# Patient Record
Sex: Female | Born: 2000 | Race: Black or African American | Hispanic: No | Marital: Single | Smoking: Never smoker
Health system: Southern US, Community
[De-identification: ages and names within clinical notes are randomized; demographics above are authoritative.]

## PROBLEM LIST (undated history)

## (undated) DIAGNOSIS — D649 Anemia, unspecified: Secondary | ICD-10-CM

---
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
## 2022-06-18 DIAGNOSIS — N938 Other specified abnormal uterine and vaginal bleeding: Secondary | ICD-10-CM | POA: Diagnosis not present

## 2022-06-18 DIAGNOSIS — Z3046 Encounter for surveillance of implantable subdermal contraceptive: Secondary | ICD-10-CM | POA: Diagnosis not present

## 2023-08-28 ENCOUNTER — Ambulatory Visit
Admission: EM | Admit: 2023-08-28 | Discharge: 2023-08-28 | Disposition: A | Discharge status: Home / Self Care | Payer: Medicaid Other | Attending: Internal Medicine | Admitting: Internal Medicine

## 2023-08-28 DIAGNOSIS — B9789 Other viral agents as the cause of diseases classified elsewhere: Secondary | ICD-10-CM | POA: Diagnosis not present

## 2023-08-28 DIAGNOSIS — R070 Pain in throat: Secondary | ICD-10-CM | POA: Diagnosis not present

## 2023-08-28 DIAGNOSIS — J988 Other specified respiratory disorders: Secondary | ICD-10-CM

## 2023-08-28 LAB — POCT RAPID STREP A (OFFICE): Rapid Strep A Screen: NEGATIVE

## 2023-08-28 MED ORDER — PROMETHAZINE-DM 6.25-15 MG/5ML PO SYRP: 5.0000 mL | ORAL_SOLUTION | Freq: Three times a day (TID) | ORAL | 0 refills | Status: AC | PRN

## 2023-08-28 MED ORDER — CETIRIZINE HCL 10 MG PO TABS: 10.0000 mg | ORAL_TABLET | Freq: Every day | ORAL | 0 refills | Status: AC

## 2023-08-28 MED ORDER — IBUPROFEN 600 MG PO TABS: 600.0000 mg | ORAL_TABLET | Freq: Four times a day (QID) | ORAL | 0 refills | Status: DC | PRN

## 2023-08-28 MED ORDER — PSEUDOEPHEDRINE HCL 30 MG PO TABS: 30.0000 mg | ORAL_TABLET | Freq: Three times a day (TID) | ORAL | 0 refills | Status: AC | PRN

## 2023-08-28 NOTE — ED Triage Notes (Signed)
Pt c/o sore throat, head congestion, runny nose x 3 days-denies fever-no OTC meds PTA-NAD-steady gait

## 2023-08-28 NOTE — ED Provider Notes (Signed)
[facility]  Note:  This document was prepared using Conservation officer, historic buildings and may include unintentional dictation errors.  MRN: [id number] DOB: 2001-05-14  Subjective:   Paula Shaffer is a 22 y.o. female presenting for 3-day history of acute onset sinus congestion, throat pain, runny nose, slight malaise and fatigue.  No known sick exposures.  No history of asthma or chronic conditions.  No current facility-administered medications for this encounter.  Current Outpatient Medications:    acetaminophen (TYLENOL) 500 MG tablet, Take 2 tablets (1,000 mg total) by mouth every 8 (eight) hours as needed (pain)., Disp: 60 tablet, Rfl: 0   Ferrous Sulfate (IRON PO), Take by mouth., Disp: , Rfl:    ibuprofen (ADVIL) 600 MG tablet, Take 1 tablet (600 mg total) by mouth every 6 (six) hours as needed (pain)., Disp: 40 tablet, Rfl: 0   Prenatal Multivit-Min-Fe-FA (PRE-NATAL PO), Take by mouth., Disp: , Rfl:    Allergies  Allergen Reactions   Penicillins     Past Medical History:  Diagnosis Date   Anemia      History reviewed. No pertinent surgical history.  No family history on file.  Social History   Tobacco Use   Smoking status: Never   Smokeless tobacco: Never  Vaping Use   Vaping status: Every Day  Substance Use Topics   Alcohol use: Yes    Comment: occ   Drug use: Yes    Types: Marijuana    ROS   Objective:   Vitals: BP 104/69 (BP Location: Right Arm)   Pulse 84   Temp 97.8 F (36.6 C) (Oral)   Resp 20   LMP 08/20/2023   SpO2 98%   Physical Exam Constitutional:      General: She is not in acute distress.    Appearance: Normal appearance. She is well-developed and normal weight. She is not ill-appearing, toxic-appearing or diaphoretic.  HENT:     Head: Normocephalic and atraumatic.     Right Ear: Tympanic membrane, ear canal and external ear normal. No drainage or tenderness. No middle ear effusion. There is no  impacted cerumen. Tympanic membrane is not erythematous or bulging.     Left Ear: Tympanic membrane, ear canal and external ear normal. No drainage or tenderness.  No middle ear effusion. There is no impacted cerumen. Tympanic membrane is not erythematous or bulging.     Nose: Nose normal. No congestion or rhinorrhea.     Mouth/Throat:     Mouth: Mucous membranes are moist. No oral lesions.     Pharynx: No pharyngeal swelling, oropharyngeal exudate, posterior oropharyngeal erythema or uvula swelling.     Tonsils: No tonsillar exudate or tonsillar abscesses.  Eyes:     General: No scleral icterus.       Right eye: No discharge.        Left eye: No discharge.     Extraocular Movements: Extraocular movements intact.     Right eye: Normal extraocular motion.     Left eye: Normal extraocular motion.     Conjunctiva/sclera: Conjunctivae normal.  Cardiovascular:     Rate and Rhythm: Normal rate and regular rhythm.     Heart sounds: Normal heart sounds. No murmur heard.    No friction rub. No gallop.  Pulmonary:     Effort: Pulmonary effort is normal. No respiratory distress.     Breath sounds: No stridor. No wheezing, rhonchi or rales.  Chest:     Chest wall: No tenderness.  Musculoskeletal:     Cervical back: Normal range of motion and neck supple.  Lymphadenopathy:     Cervical: No cervical adenopathy.  Skin:    General: Skin is warm and dry.  Neurological:     General: No focal deficit present.     Mental Status: She is alert and oriented to person, place, and time.  Psychiatric:        Mood and Affect: Mood normal.        Behavior: Behavior normal.     Results for orders placed or performed during the hospital encounter of 08/28/23 (from the past 24 hour(s))  POCT rapid strep A     Status: None   Collection Time: 08/28/23  5:57 PM  Result Value Ref Range   Rapid Strep A Screen Negative Negative    Assessment and Plan :   PDMP not reviewed this encounter.  1. Viral  respiratory infection   2. Throat pain    Throat culture pending.  Deferred imaging given clear cardiopulmonary exam, hemodynamically stable vital signs.  Suspect viral URI, viral syndrome. Physical exam findings reassuring and vital signs stable for discharge. Advised supportive care, offered symptomatic relief. Counseled patient on potential for adverse effects with medications prescribed/recommended today, ER and return-to-clinic precautions discussed, patient verbalized understanding.     Wallis Bamberg, [place] 08/28/23 [id number]

## 2023-08-28 NOTE — Discharge Instructions (Signed)
We will manage this as a viral illness. For sore throat or cough try using a honey-based tea. Use 3 teaspoons of honey with juice squeezed from half lemon. Place shaved pieces of ginger into 1/2-1 cup of water and warm over stove top. Then mix the ingredients and repeat every 4 hours as needed. Please take ibuprofen 600mg  every 6 hours with food alternating with OR taken together with Tylenol 500mg -650mg  every 6 hours for throat pain, fevers, aches and pains. Hydrate very well with at least 2 liters of water. Eat light meals such as soups (chicken and noodles, vegetable, chicken and wild rice).  Do not eat foods that you are allergic to.  Taking an antihistamine like Zyrtec (10mg  daily) can help against postnasal drainage, sinus congestion which can cause sinus pain, sinus headaches, throat pain, painful swallowing, coughing.  You can take this together with pseudoephedrine (Sudafed) at a dose of 30mg  3 times a day or twice daily as needed for the same kind of nasal drip, congestion.  Use cough medications as needed.

## 2023-08-31 LAB — CULTURE, GROUP A STREP (THRC)

## 2023-10-18 ENCOUNTER — Ambulatory Visit
Admission: EM | Admit: 2023-10-18 | Discharge: 2023-10-18 | Disposition: A | Discharge status: Home / Self Care | Payer: Medicaid Other | Attending: Family Medicine | Admitting: Family Medicine

## 2023-10-18 DIAGNOSIS — N3001 Acute cystitis with hematuria: Secondary | ICD-10-CM | POA: Diagnosis not present

## 2023-10-18 DIAGNOSIS — Z113 Encounter for screening for infections with a predominantly sexual mode of transmission: Secondary | ICD-10-CM | POA: Insufficient documentation

## 2023-10-18 LAB — POCT URINALYSIS DIP (MANUAL ENTRY)
Bilirubin, UA: NEGATIVE
Glucose, UA: NEGATIVE mg/dL
Ketones, POC UA: NEGATIVE mg/dL
Nitrite, UA: POSITIVE — AB
Protein Ur, POC: 30 mg/dL — AB
Spec Grav, UA: 1.025 (ref 1.010–1.025)
Urobilinogen, UA: 1 U/dL
pH, UA: 7.5 (ref 5.0–8.0)

## 2023-10-18 LAB — POCT URINE PREGNANCY: Preg Test, Ur: NEGATIVE

## 2023-10-18 MED ORDER — NITROFURANTOIN MONOHYD MACRO 100 MG PO CAPS: 100.0000 mg | ORAL_CAPSULE | Freq: Two times a day (BID) | ORAL | 0 refills | Status: AC

## 2023-10-18 NOTE — ED Triage Notes (Signed)
 Pt presents with c/o painful urination x 1 wk.   Pt has some lower abd pain and has a odor in urine.

## 2023-10-18 NOTE — Discharge Instructions (Addendum)
 The clinic will contact you with results of the urine culture and STD testing done today if positive.  Start Macrobid  twice daily for 7 days.  Lots of rest and fluids.  Please follow-up with your PCP if your symptoms do not improve.  Please go to the ER for any worsening symptoms.  I hope you feel better soon!

## 2023-10-18 NOTE — ED Provider Notes (Addendum)
 [facility]    CSN: [id number] Arrival date & time: 10/18/23  1405      History   Chief Complaint No chief complaint on file.   HPI Paula Shaffer is a 23 y.o. female presents for urinary symptoms.  Patient reports 1 week of suprapubic pressure/pain at the end of urination.  Denies any urgency, frequency, hematuria, fevers, nausea/vomiting, flank pain.  No vaginal discharge or STD concern but she would like screening while here.  Denies pregnancy or breast-feeding.  She has not taken any OTC medications for her symptoms.  No other concerns at this time.  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 No pertinent surgical history.  OB History     Gravida  1   Para  1   Term  1   Preterm      AB      Living  1      SAB      IAB      Ectopic      Multiple  0   Live Births  1            Home Medications    Prior to Admission medications   Medication Sig Start Date End Date Taking? Authorizing Provider  nitrofurantoin , macrocrystal-monohydrate, (MACROBID ) 100 MG capsule Take 1 capsule (100 mg total) by mouth 2 (two) times daily for 7 days. 10/18/23 10/25/23 Yes Boe Deans, Jodi R, NP  acetaminophen  (TYLENOL ) 500 MG tablet Take 2 tablets (1,000 mg total) by mouth every 8 (eight) hours as needed (pain). 12/03/21   Clem Tawni HERO, MD   cetirizine  (ZYRTEC  ALLERGY) 10 MG tablet Take 1 tablet (10 mg total) by mouth daily. 08/28/23   Christopher Savannah, PA-C  Ferrous Sulfate (IRON  PO) Take by mouth.    [provider]  ibuprofen  (ADVIL ) 600 MG tablet Take 1 tablet (600 mg total) by mouth every 6 (six) hours as needed. 08/28/23   Christopher Savannah, PA-C  Prenatal Multivit-Min-Fe-FA (PRE-NATAL PO) Take by mouth.    [provider]  promethazine -dextromethorphan (PROMETHAZINE -DM) 6.25-15 MG/5ML syrup Take 5 mLs by mouth 3 (three) times daily as needed for cough. 08/28/23   Christopher Savannah, PA-C  pseudoephedrine  (SUDAFED) 30 MG tablet Take 1 tablet (30 mg total) by mouth every 8 (eight) hours as needed for congestion. 08/28/23   Christopher Savannah, PA-C    Family History History reviewed. No pertinent family history.  Social History Social History   Tobacco Use   Smoking status: Never   Smokeless tobacco: Never  Vaping Use   Vaping status: Every Day  Substance Use Topics   Alcohol use: Yes    Comment: occ   Drug use: Yes    Types: Marijuana     Allergies   Penicillins   Review of Systems Review of Systems  Genitourinary:  Positive for dysuria.     Physical Exam Triage Vital Signs  ED Triage Vitals  Encounter Vitals Group     BP 10/18/23 1503 111/69     Systolic BP Percentile --      Diastolic BP Percentile --      Pulse Rate 10/18/23 1503 70     Resp 10/18/23 1503 18     Temp 10/18/23 1503 98.2 F (36.8 C)     Temp Source 10/18/23 1503 Oral     SpO2 10/18/23 1503 98 %     Weight --      Height --      Head Circumference --      Peak Flow --      Pain Score 10/18/23 1502 4     Pain Loc --      Pain Education --      Exclude from Growth Chart --    No data found.  Updated Vital Signs BP 111/69 (BP Location: Right Arm)   Pulse 70   Temp 98.2 F (36.8 C) (Oral)   Resp 18   LMP 09/28/2023 (Exact Date)   SpO2 98%   Breastfeeding No   Visual Acuity Right Eye Distance:   Left Eye Distance:    Bilateral Distance:    Right Eye Near:   Left Eye Near:    Bilateral Near:     Physical Exam Vitals and nursing note reviewed.  Constitutional:      General: She is not in acute distress.    Appearance: Normal appearance. She is not ill-appearing.  HENT:     Head: Normocephalic and atraumatic.  Eyes:     Pupils: Pupils are equal, round, and reactive to light.  Cardiovascular:     Rate and Rhythm: Normal rate.  Pulmonary:     Effort: Pulmonary effort is normal.  Abdominal:     Tenderness: There is no right CVA tenderness or left CVA tenderness.  Skin:    General: Skin is warm and dry.  Neurological:     General: No focal deficit present.     Mental Status: She is alert and oriented to person, place, and time.  Psychiatric:        Mood and Affect: Mood normal.        Behavior: Behavior normal.      UC Treatments / Results  Labs (all labs ordered are listed, but only abnormal results are displayed) Labs Reviewed  POCT URINALYSIS DIP (MANUAL ENTRY) - Abnormal; Notable for the following components:      Result Value   Color, UA orange (*)    Clarity, UA cloudy (*)    Blood, UA trace-intact (*)    Protein Ur, POC =30 (*)    Nitrite, UA Positive (*)    Leukocytes, UA Small (1+) (*)    All other components within normal limits  URINE CULTURE  RPR  HIV ANTIBODY (ROUTINE TESTING W REFLEX)  POCT URINE PREGNANCY  CERVICOVAGINAL ANCILLARY ONLY    EKG   Radiology No results found.  Procedures Procedures (including critical care time)  Medications Ordered in UC Medications - No data to display  Initial Impression / Assessment and Plan / UC Course  I have reviewed the triage vital signs and the nursing notes.  Pertinent labs & imaging results that were available during my care of the patient were reviewed by me and considered in my medical decision making (see chart for details).     Reviewed exam and symptoms with patient.  No red flags.  UA positive for  UTI, will  culture and start antibiotics.  STD testing is ordered we will contact for any positive results.  Encouraged rest and fluids.  PCP follow-up if symptoms do not improve.  ER precautions reviewed. Final Clinical Impressions(s) / UC Diagnoses   Final diagnoses:  Acute cystitis with hematuria  Screening examination for STD (sexually transmitted disease)     Discharge Instructions      The clinic will contact you with results of the urine culture and STD testing done today if positive.  Start Macrobid  twice daily for 7 days.  Lots of rest and fluids.  Please follow-up with your PCP if your symptoms do not improve.  Please go to the ER for any worsening symptoms.  I hope you feel better soon!     ED Prescriptions     Medication Sig Dispense Auth. Provider   nitrofurantoin , macrocrystal-monohydrate, (MACROBID ) 100 MG capsule Take 1 capsule (100 mg total) by mouth 2 (two) times daily for 7 days. 14 capsule Alecea Trego, Jodi R, NP      PDMP not reviewed this encounter.   Loreda Myla SAUNDERS, NP 10/18/23 1521    Loreda Myla SAUNDERS, NP 10/18/23 [phone]

## 2023-10-19 LAB — CERVICOVAGINAL ANCILLARY ONLY
Chlamydia: NEGATIVE
Comment: NEGATIVE
Comment: NEGATIVE
Comment: NORMAL
Neisseria Gonorrhea: NEGATIVE
Trichomonas: NEGATIVE

## 2023-10-20 LAB — URINE CULTURE: Culture: >=100000 — AB

## 2023-10-20 LAB — RPR: RPR Ser Ql: NONREACTIVE

## 2023-10-20 LAB — HIV ANTIBODY (ROUTINE TESTING W REFLEX): HIV Screen 4th Generation wRfx: NONREACTIVE

## 2023-10-23 ENCOUNTER — Emergency Department (HOSPITAL_COMMUNITY)
Admission: EM | Admit: 2023-10-23 | Discharge: 2023-10-24 | Disposition: A | Discharge status: Home / Self Care | Payer: Medicaid Other | Attending: Emergency Medicine | Admitting: Emergency Medicine

## 2023-10-23 ENCOUNTER — Encounter (HOSPITAL_COMMUNITY): Payer: Self-pay | Admitting: Emergency Medicine

## 2023-10-23 ENCOUNTER — Other Ambulatory Visit: Payer: Self-pay

## 2023-10-23 DIAGNOSIS — K029 Dental caries, unspecified: Secondary | ICD-10-CM | POA: Insufficient documentation

## 2023-10-23 DIAGNOSIS — K0889 Other specified disorders of teeth and supporting structures: Secondary | ICD-10-CM | POA: Diagnosis present

## 2023-10-23 MED ORDER — LIDOCAINE VISCOUS HCL 2 % MT SOLN
15.0000 mL | Freq: Once | OROMUCOSAL | Status: AC
  Administered 2023-10-24: 15 mL via OROMUCOSAL
  Filled 2023-10-23: qty 15

## 2023-10-23 MED ORDER — CLINDAMYCIN HCL 300 MG PO CAPS
300.0000 mg | ORAL_CAPSULE | Freq: Once | ORAL | Status: AC
  Administered 2023-10-24: 300 mg via ORAL
  Filled 2023-10-23: qty 1

## 2023-10-23 NOTE — ED Triage Notes (Signed)
Pt reports dental pain that has been going on for a while now. Was seen at [facility] and given meds but reports it hasn't been working., Reports it is hard to swallow. Pt speaking in full sentences.

## 2023-10-24 MED ORDER — CLINDAMYCIN HCL 300 MG PO CAPS: 300.0000 mg | ORAL_CAPSULE | Freq: Three times a day (TID) | ORAL | 0 refills | Status: DC

## 2023-10-24 NOTE — ED Provider Notes (Signed)
[place] [facility] AT [facility] Provider Note   CSN: [id number] Arrival date & time: 10/23/23  2256     History  Chief Complaint  Patient presents with   Dental Pain    Paula Shaffer is a 23 y.o. female.  The history is provided by the patient.  Dental Pain Location:  Lower Lower teeth location:  31/RL 2nd molar and 30/RL 1st molar Quality:  Aching Severity:  Severe Duration:  2 months Timing:  Constant Progression:  Unchanged Chronicity:  New Context: dental caries   Previous work-up:  Dental exam Relieved by:  Nothing Worsened by:  Nothing Ineffective treatments:  None tried Associated symptoms: no congestion, no facial swelling and no fever        Home Medications Prior to Admission medications   Medication Sig Start Date End Date Taking? Authorizing Provider  clindamycin (CLEOCIN) 300 MG capsule Take 1 capsule (300 mg total) by mouth 3 (three) times daily. 10/24/23  Yes Ladarrion Telfair, MD  acetaminophen (TYLENOL) 500 MG tablet Take 2 tablets (1,000 mg total) by mouth every 8 (eight) hours as needed (pain). 12/03/21   Worthy Rancher, MD  cetirizine (ZYRTEC ALLERGY) 10 MG tablet Take 1 tablet (10 mg total) by mouth daily. 08/28/23   Wallis Bamberg, PA-C  Ferrous Sulfate (IRON PO) Take by mouth.    [provider]  ibuprofen (ADVIL) 600 MG tablet Take 1 tablet (600 mg total) by mouth every 6 (six) hours as needed. 08/28/23   Wallis Bamberg, PA-C  nitrofurantoin, macrocrystal-monohydrate, (MACROBID) 100 MG capsule Take 1 capsule (100 mg total) by mouth 2 (two) times daily for 7 days. 10/18/23 10/25/23  Radford Pax, NP  Prenatal Multivit-Min-Fe-FA (PRE-NATAL PO) Take by mouth.    [provider]  promethazine-dextromethorphan (PROMETHAZINE-DM) 6.25-15 MG/5ML syrup Take 5 mLs by mouth 3 (three) times daily as needed for cough. 08/28/23   Wallis Bamberg, PA-C  pseudoephedrine (SUDAFED) 30 MG tablet Take 1 tablet (30 mg total) by  mouth every 8 (eight) hours as needed for congestion. 08/28/23   Wallis Bamberg, PA-C      Allergies    Penicillins    Review of Systems   Review of Systems  Constitutional:  Negative for fever.  HENT:  Positive for dental problem. Negative for congestion and facial swelling.   Eyes:  Negative for redness.  All other systems reviewed and are negative.   Physical Exam Updated Vital Signs BP (!) 145/133 (BP Location: Left Arm)   Pulse 97   Temp 98.2 F (36.8 C) (Oral)   Resp 18   LMP 09/28/2023 (Exact Date)   SpO2 100%  Physical Exam Vitals and nursing note reviewed.  Constitutional:      General: She is not in acute distress.    Appearance: She is well-developed.  HENT:     Head: Normocephalic and atraumatic.     Nose: Nose normal.     Mouth/Throat:     Comments: Dental caries  Eyes:     Pupils: Pupils are equal, round, and reactive to light.  Cardiovascular:     Rate and Rhythm: Normal rate and regular rhythm.     Pulses: Normal pulses.     Heart sounds: Normal heart sounds.  Pulmonary:     Effort: Pulmonary effort is normal. No respiratory distress.     Breath sounds: Normal breath sounds.  Abdominal:     General: Bowel sounds are normal. There is no distension.     Palpations:  Abdomen is soft.     Tenderness: There is no abdominal tenderness. There is no guarding or rebound.  Musculoskeletal:        General: Normal range of motion.     Cervical back: Neck supple.  Skin:    General: Skin is dry.     Capillary Refill: Capillary refill takes less than 2 seconds.     Findings: No erythema or rash.  Neurological:     General: No focal deficit present.     Deep Tendon Reflexes: Reflexes normal.  Psychiatric:        Mood and Affect: Mood normal.     ED Results / Procedures / Treatments   Labs (all labs ordered are listed, but only abnormal results are displayed) Labs Reviewed - No data to display  EKG None  Radiology No results  found.  Procedures Procedures    Medications Ordered in ED Medications  clindamycin (CLEOCIN) capsule 300 mg (300 mg Oral Given 10/24/23 0002)  lidocaine (XYLOCAINE) 2 % viscous mouth solution 15 mL (15 mLs Mouth/Throat Given 10/24/23 0003)    ED Course/ Medical Decision Making/ A&P                                 Medical Decision Making Dental pain x 2 months seeing a dentist on [place]   Amount and/or Complexity of Data Reviewed External Data Reviewed: notes.    Details: Previous notes reviewed   Risk Prescription drug management. Risk Details: Clindamycin initiated for dental caries.  Follow up with dentistry for ongoing care     Final Clinical Impression(s) / ED Diagnoses Final diagnoses:  Dental caries   Return for intractable cough, coughing up blood, fevers > 100.4 unrelieved by medication, shortness of breath, intractable vomiting, chest pain, shortness of breath, weakness, numbness, changes in speech, facial asymmetry, abdominal pain, passing out, Inability to tolerate liquids or food, cough, altered mental status or any concerns. No signs of systemic illness or infection. The patient is nontoxic-appearing on exam and vital signs are within normal limits.  I have reviewed the triage vital signs and the nursing notes. Pertinent labs & imaging results that were available during my care of the patient were reviewed by me and considered in my medical decision making (see chart for details). After history, exam, and medical workup I feel the patient has been appropriately medically screened and is safe for discharge home. Pertinent diagnoses were discussed with the patient. Patient was given return precautions.  Rx / DC Orders ED Discharge Orders          Ordered    clindamycin (CLEOCIN) 300 MG capsule  3 times daily        10/24/23 0025              Ulysee Fyock, MD 10/24/23 1027

## 2023-11-19 ENCOUNTER — Ambulatory Visit
Admission: EM | Admit: 2023-11-19 | Discharge: 2023-11-19 | Disposition: A | Discharge status: Home / Self Care | Payer: Medicaid Other | Attending: Family Medicine | Admitting: Family Medicine

## 2023-11-19 DIAGNOSIS — J029 Acute pharyngitis, unspecified: Secondary | ICD-10-CM | POA: Diagnosis not present

## 2023-11-19 DIAGNOSIS — R051 Acute cough: Secondary | ICD-10-CM

## 2023-11-19 LAB — POCT RAPID STREP A (OFFICE): Rapid Strep A Screen: NEGATIVE

## 2023-11-19 MED ORDER — BENZONATATE 200 MG PO CAPS: 200.0000 mg | ORAL_CAPSULE | Freq: Three times a day (TID) | ORAL | 0 refills | Status: AC | PRN

## 2023-11-19 MED ORDER — AZITHROMYCIN 250 MG PO TABS: 250.0000 mg | ORAL_TABLET | Freq: Every day | ORAL | 0 refills | Status: AC

## 2023-11-19 NOTE — Discharge Instructions (Signed)
The clinic will contact you with results of the throat swabs done today if positive.  Start Zithromax as prescribed.  You may also do salt water gargles and warm liquids such as teas and honey.  Continue Tylenol or ibuprofen over-the-counter as needed.  Please follow-up with your PCP if your symptoms do not improve.  Please go to the ER for any worsening symptoms.  Hope you feel better soon!

## 2023-11-19 NOTE — ED Triage Notes (Signed)
Pt presents to UC for c/o sore throat, fevers, white patches on tonsils x3 days. Took tylenol and promethazine

## 2023-11-19 NOTE — ED Provider Notes (Signed)
[facility]    CSN: [id number] Arrival date & time: 11/19/23  0840      History   Chief Complaint No chief complaint on file.   HPI Paula Shaffer is a 23 y.o. female  presents for evaluation of URI symptoms for 3 days. Patient reports associated symptoms of sore throat with white patches, tactile fevers, cough congestion. Denies N/V/D, ear pain, body aches, shortness of breath. Patient does not have a hx of asthma. Patient does vape.  Reports no sick contacts.  Pt has taken Tylenol and a previous prescription for Promethazine DM for symptoms.  Denies pregnancy or breast-feeding.  Pt has no other concerns at this time.   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 No pertinent surgical history.  OB History     Gravida  1   Para  1   Term  1   Preterm      AB      Living  1      SAB      IAB      Ectopic      Multiple  0   Live Births  1            Home Medications    Prior to Admission medications   Medication Sig Start Date End Date Taking? Authorizing Provider  azithromycin (ZITHROMAX) 250 MG tablet Take 1 tablet (250 mg total) by mouth daily. Take first 2 tablets together, then 1 every day until finished. 11/19/23  Yes Radford Pax, NP  benzonatate (TESSALON) 200 MG capsule Take 1 capsule (200 mg total) by mouth 3 (three)  times daily as needed. 11/19/23  Yes Radford Pax, NP  acetaminophen (TYLENOL) 500 MG tablet Take 2 tablets (1,000 mg total) by mouth every 8 (eight) hours as needed (pain). 12/03/21   Worthy Rancher, MD  cetirizine (ZYRTEC ALLERGY) 10 MG tablet Take 1 tablet (10 mg total) by mouth daily. 08/28/23   Wallis Bamberg, PA-C  clindamycin (CLEOCIN) 300 MG capsule Take 1 capsule (300 mg total) by mouth 3 (three) times daily. 10/24/23   Palumbo, April, MD  Ferrous Sulfate (IRON PO) Take by mouth.    [provider]  ibuprofen (ADVIL) 600 MG tablet Take 1 tablet (600 mg total) by mouth every 6 (six) hours as needed. 08/28/23   Wallis Bamberg, PA-C  Prenatal Multivit-Min-Fe-FA (PRE-NATAL PO) Take by mouth.    [provider]  promethazine-dextromethorphan (PROMETHAZINE-DM) 6.25-15 MG/5ML syrup Take 5 mLs by mouth 3 (three) times daily as needed for cough. 08/28/23   Wallis Bamberg, PA-C  pseudoephedrine (SUDAFED) 30 MG tablet Take 1 tablet (30 mg total) by mouth every 8 (eight) hours as needed for congestion. 08/28/23   Wallis Bamberg, PA-C    Family History History reviewed. No pertinent family history.  Social History Social History   Tobacco Use   Smoking status: Never  Smokeless tobacco: Never  Vaping Use   Vaping status: Every Day  Substance Use Topics   Alcohol use: Yes    Comment: occ   Drug use: Yes    Types: Marijuana     Allergies   Penicillins   Review of Systems Review of Systems  Constitutional:  Positive for fever.  HENT:  Positive for congestion and sore throat.   Respiratory:  Positive for cough.      Physical Exam Triage Vital Signs ED Triage Vitals  Encounter Vitals Group     BP 11/19/23 0847 99/64     Systolic BP Percentile --      Diastolic BP Percentile --      Pulse Rate 11/19/23 0847 96     Resp 11/19/23 0847 16     Temp 11/19/23 0847 98.9 F (37.2 C)     Temp Source 11/19/23 0847 Oral     SpO2 11/19/23 0847 96 %     Weight --      Height  --      Head Circumference --      Peak Flow --      Pain Score 11/19/23 0845 10     Pain Loc --      Pain Education --      Exclude from Growth Chart --    No data found.  Updated Vital Signs BP 99/64 (BP Location: Right Arm)   Pulse 96   Temp 98.9 F (37.2 C) (Oral)   Resp 16   SpO2 96%   Breastfeeding No   Visual Acuity Right Eye Distance:   Left Eye Distance:   Bilateral Distance:    Right Eye Near:   Left Eye Near:    Bilateral Near:     Physical Exam Vitals and nursing note reviewed.  Constitutional:      General: She is not in acute distress.    Appearance: She is well-developed. She is not ill-appearing.  HENT:     Head: Normocephalic and atraumatic.     Right Ear: Tympanic membrane and ear canal normal.     Left Ear: Tympanic membrane and ear canal normal.     Nose: Congestion present.     Mouth/Throat:     Mouth: Mucous membranes are moist.     Pharynx: Oropharynx is clear. Uvula midline. Posterior oropharyngeal erythema present. No oropharyngeal exudate.     Tonsils: No tonsillar exudate or tonsillar abscesses.     Comments: Blisters noted bilateral tonsils Eyes:     Conjunctiva/sclera: Conjunctivae normal.     Pupils: Pupils are equal, round, and reactive to light.  Cardiovascular:     Rate and Rhythm: Normal rate and regular rhythm.     Heart sounds: Normal heart sounds.  Pulmonary:     Effort: Pulmonary effort is normal.     Breath sounds: Normal breath sounds.  Musculoskeletal:     Cervical back: Normal range of motion and neck supple.  Lymphadenopathy:     Cervical: No cervical adenopathy.  Skin:    General: Skin is warm and dry.  Neurological:     General: No focal deficit present.     Mental Status: She is alert and oriented to person, place, and time.  Psychiatric:        Mood and Affect: Mood normal.        Behavior: Behavior normal.      UC Treatments / Results  Labs (all labs ordered are listed, but only abnormal results are  displayed) Labs Reviewed  CULTURE, GROUP A STREP [facility])  POCT RAPID STREP A (OFFICE)  CYTOLOGY, (ORAL, ANAL, URETHRAL) ANCILLARY ONLY    EKG   Radiology No results found.  Procedures Procedures (including critical care time)  Medications Ordered in UC Medications - No data to display  Initial Impression / Assessment and Plan / UC Course  I have reviewed the triage vital signs and the nursing notes.  Pertinent labs & imaging results that were available during my care of the patient were reviewed by me and considered in my medical decision making (see chart for details).     Reviewed exam and symptoms with patient.  No red flags.  Negative rapid strep, will culture.  Patient requested gonorrhea of the throat testing, swab taken will contact for any positive results.  Given presentation will treat with Zithromax while awaiting results, patient has penicillin allergy.  Discussed salt gargles/warm liquids and OTC analgesics as needed.  PCP follow-up as symptoms do not improve.  ER precautions reviewed and patient verbalized understanding. Final Clinical Impressions(s) / UC Diagnoses   Final diagnoses:  Acute pharyngitis, unspecified etiology  Acute cough     Discharge Instructions      The clinic will contact you with results of the throat swabs done today if positive.  Start Zithromax as prescribed.  You may also do salt water gargles and warm liquids such as teas and honey.  Continue Tylenol or ibuprofen over-the-counter as needed.  Please follow-up with your PCP if your symptoms do not improve.  Please go to the ER for any worsening symptoms.  Hope you feel better soon!    ED Prescriptions     Medication Sig Dispense Auth. Provider   azithromycin (ZITHROMAX) 250 MG tablet Take 1 tablet (250 mg total) by mouth daily. Take first 2 tablets together, then 1 every day until finished. 6 tablet Radford Pax, NP   benzonatate (TESSALON) 200 MG capsule Take 1 capsule (200 mg  total) by mouth 3 (three) times daily as needed. 20 capsule Radford Pax, NP      PDMP not reviewed this encounter.   Radford Pax, NP 11/19/23 [phone]

## 2023-11-20 LAB — CYTOLOGY, (ORAL, ANAL, URETHRAL) ANCILLARY ONLY
Chlamydia: NEGATIVE
Comment: NEGATIVE
Comment: NORMAL
Neisseria Gonorrhea: NEGATIVE

## 2023-11-21 ENCOUNTER — Emergency Department (HOSPITAL_COMMUNITY)
Admission: EM | Admit: 2023-11-21 | Discharge: 2023-11-21 | Disposition: A | Discharge status: Home / Self Care | Payer: Medicaid Other | Attending: Emergency Medicine | Admitting: Emergency Medicine

## 2023-11-21 DIAGNOSIS — Z79899 Other long term (current) drug therapy: Secondary | ICD-10-CM | POA: Insufficient documentation

## 2023-11-21 DIAGNOSIS — Z20822 Contact with and (suspected) exposure to covid-19: Secondary | ICD-10-CM | POA: Diagnosis not present

## 2023-11-21 DIAGNOSIS — J029 Acute pharyngitis, unspecified: Secondary | ICD-10-CM | POA: Insufficient documentation

## 2023-11-21 LAB — RESP PANEL BY RT-PCR (RSV, FLU A&B, COVID)  RVPGX2
Influenza A by PCR: NEGATIVE
Influenza B by PCR: NEGATIVE
Resp Syncytial Virus by PCR: NEGATIVE
SARS Coronavirus 2 by RT PCR: NEGATIVE

## 2023-11-21 LAB — CULTURE, GROUP A STREP (THRC)

## 2023-11-21 LAB — MONONUCLEOSIS SCREEN: Mono Screen: NEGATIVE

## 2023-11-21 MED ORDER — CLINDAMYCIN PALMITATE HCL 75 MG/5ML PO SOLR: 300.0000 mg | Freq: Three times a day (TID) | ORAL | 0 refills | Status: DC

## 2023-11-21 MED ORDER — KETOROLAC TROMETHAMINE 30 MG/ML IJ SOLN
30.0000 mg | Freq: Once | INTRAMUSCULAR | Status: AC
  Administered 2023-11-21: 30 mg via INTRAMUSCULAR
  Filled 2023-11-21: qty 1

## 2023-11-21 MED ORDER — LIDOCAINE VISCOUS HCL 2 % MT SOLN
15.0000 mL | Freq: Once | OROMUCOSAL | Status: AC
  Administered 2023-11-21: 15 mL via OROMUCOSAL
  Filled 2023-11-21: qty 15

## 2023-11-21 MED ORDER — PREDNISONE 20 MG PO TABS: 40.0000 mg | ORAL_TABLET | Freq: Every day | ORAL | 0 refills | Status: AC

## 2023-11-21 MED ORDER — MELOXICAM 15 MG PO TABS: 15.0000 mg | ORAL_TABLET | Freq: Every day | ORAL | 0 refills | Status: AC | PRN

## 2023-11-21 MED ORDER — DEXAMETHASONE 4 MG PO TABS
10.0000 mg | ORAL_TABLET | Freq: Once | ORAL | Status: AC
  Administered 2023-11-21: 10 mg via ORAL
  Filled 2023-11-21: qty 1

## 2023-11-21 MED ORDER — DEXAMETHASONE 1 MG/ML PO CONC: 10.0000 mg | Freq: Once | ORAL | Status: DC

## 2023-11-21 NOTE — ED Provider Notes (Signed)
 [place] EMERGENCY DEPARTMENT AT [facility] Provider Note   CSN: [id number] Arrival date & time: 11/21/23  1035     History  Chief Complaint  Patient presents with   Sore Throat    Paula Shaffer is a 23 y.o. female.   Sore Throat   23 year old female presents emergency department complaints of sore throat.  Was seen urgent care 2 days ago for similar symptoms.  In total, symptoms have been about 5 days or so.  Denies any known fever, chills, difficulty breathing, feelings of throat closing in on her, cough, congestion.  States that she had negative strep testing as well as oral GC/chlamydia testing.  States she sent home with anti-inflammatory which did help with her symptoms but presents due to slight worsening of symptoms.  Now reports worsening pain with swallowing but still able to swallow foods and liquids.  Past medical history significant for anemia, chlamydia infection, GBS  Home Medications Prior to Admission medications   Medication Sig Start Date End Date Taking? Authorizing Provider  meloxicam (MOBIC) 15 MG tablet Take 1 tablet (15 mg total) by mouth daily as needed for pain. 11/21/23  Yes Sherian Maroon A, PA  predniSONE (DELTASONE) 20 MG tablet Take 2 tablets (40 mg total) by mouth daily with breakfast for 5 days. 11/23/23 11/28/23 Yes Sherian Maroon A, PA  acetaminophen (TYLENOL) 500 MG tablet Take 2 tablets (1,000 mg total) by mouth every 8 (eight) hours as needed (pain). 12/03/21   Worthy Rancher, MD  azithromycin (ZITHROMAX) 250 MG tablet Take 1 tablet (250 mg total) by mouth daily. Take first 2 tablets together, then 1 every day until finished. 11/19/23   Radford Pax, NP  benzonatate (TESSALON) 200 MG capsule Take 1 capsule (200 mg total) by mouth 3 (three) times daily as needed. 11/19/23   Radford Pax, NP  cetirizine (ZYRTEC ALLERGY) 10 MG tablet Take 1 tablet (10 mg total) by mouth daily. 08/28/23   Wallis Bamberg, PA-C  clindamycin (CLEOCIN)  300 MG capsule Take 1 capsule (300 mg total) by mouth 3 (three) times daily. 10/24/23   Palumbo, April, MD  Ferrous Sulfate (IRON PO) Take by mouth.    [provider]  Prenatal Multivit-Min-Fe-FA (PRE-NATAL PO) Take by mouth.    [provider]  promethazine-dextromethorphan (PROMETHAZINE-DM) 6.25-15 MG/5ML syrup Take 5 mLs by mouth 3 (three) times daily as needed for cough. 08/28/23   Wallis Bamberg, PA-C  pseudoephedrine (SUDAFED) 30 MG tablet Take 1 tablet (30 mg total) by mouth every 8 (eight) hours as needed for congestion. 08/28/23   Wallis Bamberg, PA-C      Allergies    Penicillins    Review of Systems   Review of Systems  All other systems reviewed and are negative.   Physical Exam Updated Vital Signs BP 114/73 (BP Location: Left Arm)   Pulse 90   Temp 99 F (37.2 C) (Oral)   Resp 16   Ht 5\' 7"  (1.702 m)   LMP 11/16/2023   SpO2 98%   BMI 40.72 kg/m  Physical Exam Vitals and nursing note reviewed.  Constitutional:      General: She is not in acute distress.    Appearance: She is well-developed.  HENT:     Head: Normocephalic and atraumatic.     Comments: Patient with posterior pharyngeal erythema.  Tonsils 2-3+ bilaterally with patchy exudate.  No sublingual submandibular swelling.  No trismus.  No changes in phonation per patient.  Uvula midline  and rises symmetrically with phonation. Eyes:     Conjunctiva/sclera: Conjunctivae normal.  Cardiovascular:     Rate and Rhythm: Normal rate and regular rhythm.     Heart sounds: No murmur heard. Pulmonary:     Effort: Pulmonary effort is normal. No respiratory distress.     Breath sounds: Normal breath sounds.  Abdominal:     Palpations: Abdomen is soft.     Tenderness: There is no abdominal tenderness.  Musculoskeletal:        General: No swelling.     Cervical back: Neck supple.  Skin:    General: Skin is warm and dry.     Capillary Refill: Capillary refill takes less than 2 seconds.  Neurological:      Mental Status: She is alert.  Psychiatric:        Mood and Affect: Mood normal.     ED Results / Procedures / Treatments   Labs (all labs ordered are listed, but only abnormal results are displayed) Labs Reviewed  RESP PANEL BY RT-PCR (RSV, FLU A&B, COVID)  RVPGX2  MONONUCLEOSIS SCREEN    EKG None  Radiology No results found.  Procedures Procedures    Medications Ordered in ED Medications  lidocaine (XYLOCAINE) 2 % viscous mouth solution 15 mL (has no administration in time range)  ketorolac (TORADOL) 30 MG/ML injection 30 mg (has no administration in time range)  dexamethasone (DECADRON) tablet 10 mg (has no administration in time range)    ED Course/ Medical Decision Making/ A&P                                 Medical Decision Making Amount and/or Complexity of Data Reviewed Labs: ordered.  Risk Prescription drug management.   This patient presents to the ED for concern of sore throat, this involves an extensive number of treatment options, and is a complaint that carries with it a high risk of complications and morbidity.  The differential diagnosis includes viral pharyngitis, strep pharyngitis, mononucleosis, PTA, Ludwig angina, Lemierre's disease, other   Co morbidities that complicate the patient evaluation  See HPI   Additional history obtained:  Additional history obtained from EMR External records from outside source obtained and reviewed including hospital records   Lab Tests:  I Ordered, and personally interpreted labs.  The pertinent results include: Mono, respiratory viral panel which are pending   Imaging Studies ordered:  N/a   Cardiac Monitoring: / EKG:  The patient was maintained on a cardiac monitor.  I personally viewed and interpreted the cardiac monitored which showed an underlying rhythm of: sinus rhythm   Consultations Obtained:  N/a   Problem List / ED Course / Critical interventions / Medication  management  Sore throat I ordered medication including Decadron, Toradol, lidocaine   Reevaluation of the patient after these medicines showed that the patient improved I have reviewed the patients home medicines and have made adjustments as needed   Social Determinants of Health:  Denies tobacco, licit drug use   Test / Admission - Considered:  Sore throat Vitals signs within normal range and stable throughout visit. Laboratory studies significant for: See above 23 year old female presents emergency department 5 days of sore throat.  Was seen urgent care 2 days ago with negative strep as well as GC/chlamydia testing.  On exam, bilateral tonsillar enlargement with exudate present.  Right tonsil slightly greater than left but still both enlarged with midline uvula; low  suspicion for PTA.  No clinical evidence of Ludwig angina.  Will additionally test for mononucleosis as well as viral illnesses via swab.  Patient treated with Decadron while in the ED, Toradol and did note improvement of symptoms.  No difficulty swallowing foods or liquids but with some discomfort.  Will continue similar medications in the outpatient setting after follow-up with primary care.  Will recommend her following MyChart for the results of testing.  Strict return precautions discussed if any difficulty breathing, inability to swallow, tongue swelling, changes in voice or other abnormality discussed.  She will plan discussed at length with patient and she acknowledged understanding was agreeable to said plan.  Patient overall well-appearing, afebrile in no acute distress. Worrisome signs and symptoms were discussed with the patient, and the patient acknowledged understanding to return to the ED if noticed. Patient was stable upon discharge.          Final Clinical Impression(s) / ED Diagnoses Final diagnoses:  Sore throat    Rx / DC Orders ED Discharge Orders          Ordered    predniSONE (DELTASONE) 20 MG  tablet  Daily with breakfast        11/21/23 1135    meloxicam (MOBIC) 15 MG tablet  Daily PRN        11/21/23 1135              Peter Garter, [place] 11/21/23 1147    Gloris Manchester, MD 11/22/23 [phone]

## 2023-11-21 NOTE — Discharge Instructions (Addendum)
 As discussed, your gonorrhea/chlamydia as well as strep testing was negative.  At the urgent care.  We have added on a monitor as well as a viral panel as potential causes of your sore throat.  Given the extent of swelling of your tonsils, will place you on prednisone.  We have given you 1 dose of longer acting steroid today while in the emergency department.  Recommend beginning the other steroid on the 17th and continuing its course.  Will also empirically placed on antibiotics and send in medicine for discomfort.  This meloxicam is in the same family as ibuprofen/Aleve so please do not take these medicines at the same time.  You may continue to take Tylenol as needed with the medicines prescribed.  Recommend freezing Pedialyte popsicles or drinking nutritional smoothies/milkshakes as the cold will help with swelling and discomfort in the back of your throat.  If you notice any worsening of symptoms, please return to emergency department.  Otherwise, recommend follow-up with primary care for reassessment.

## 2023-11-21 NOTE — ED Triage Notes (Signed)
 Patient reports sore throat x 4 days Went to UC previously No diagnosis made Says it is now hard to swallow Pain rated 10/10  Denies any other symptoms or sick contacts

## 2024-05-04 ENCOUNTER — Telehealth: Payer: Self-pay

## 2024-05-04 NOTE — Progress Notes (Signed)
 Complex Care Management Note Care Guide Note  05/04/2024 Name: Paula Shaffer MRN: [id number] DOB: August 11, 2001   Complex Care Management Outreach Attempts: An unsuccessful telephone outreach was attempted today to offer the patient information about available complex care management services.  Follow Up Plan:  Additional outreach attempts will be made to offer the patient complex care management information and services.   Encounter Outcome:  No Answer  Jeoffrey Buffalo , RMA     [place]  [facility], [facility] Guide  Direct Dial: [phone]  Website: [email or web address]

## 2024-05-06 NOTE — Progress Notes (Signed)
 Complex Care Management Note Care Guide Note  05/06/2024 Name: Paula Shaffer MRN: [id number] DOB: 2001/06/14   Complex Care Management Outreach Attempts: A second unsuccessful outreach was attempted today to offer the patient with information about available complex care management services.  Follow Up Plan:  Additional outreach attempts will be made to offer the patient complex care management information and services.   Encounter Outcome:  No Answer  Jeoffrey Buffalo , RMA     [place]  [facility], [facility] Guide  Direct Dial: [phone]  Website: [email or web address]

## 2024-05-09 NOTE — Progress Notes (Signed)
 Complex Care Management Note  Care Guide Note 05/09/2024 Name: Paula Shaffer MRN: [id number] DOB: 08-02-2001  Paula Shaffer is a 23 y.o. year old female who sees Patient, No Pcp Per for primary care. I reached out to Jontavia Rish by phone today to offer complex care management services.  [name] was given information about Complex Care Management services today including:   The Complex Care Management services include support from the care team which includes your Nurse Care Manager, Clinical Social Worker, or Pharmacist.  The Complex Care Management team is here to help remove barriers to the health concerns and goals most important to you. Complex Care Management services are voluntary, and the patient may decline or stop services at any time by request to their care team member.   Complex Care Management Consent Status: Patient agreed to services and verbal consent obtained.   Follow up plan:  Telephone appointment with complex care management team member scheduled for:  bsw 05/12/2024  Encounter Outcome:  Patient Scheduled  Jeoffrey Buffalo , RMA     [place]  [facility], [facility] Guide  Direct Dial: [phone]  Website: [email or web address]

## 2024-05-12 ENCOUNTER — Telehealth: Payer: Self-pay

## 2024-06-23 ENCOUNTER — Telehealth: Payer: Self-pay | Admitting: *Deleted

## 2024-06-23 NOTE — Progress Notes (Signed)
 Complex Care Management Care Guide Note  06/23/2024 Name: Cherith Tewell MRN: [id number] DOB: 2001/01/25  Jaylynn Mcaleer is a 23 y.o. year old female who is a primary care patient of Patient, No Pcp Per and is actively engaged with the care management team. I reached out to Mort Pouch by phone today to assist with re-scheduling  with the BSW.  Follow up plan: Unsuccessful telephone outreach attempt made. A HIPAA compliant phone message was left for the patient providing contact information and requesting a return call.  Harlene Satterfield  [facility]  [facility], [facility] Guide  Direct Dial: [phone]  Fax [phone]

## 2024-06-29 NOTE — Progress Notes (Signed)
 Complex Care Management Care Guide Note  06/29/2024 Name: Paula Shaffer MRN: [id number] DOB: [birth year]  Paula Shaffer is a 23 y.o. year old female who is a primary care patient of Patient, No Pcp Per and is actively engaged with the care management team. I reached out to Mort Pouch by phone today to assist with re-scheduling  with the BSW.  Follow up plan: Unsuccessful telephone outreach attempt made. No further outreach attempts will be made at this time. We have been unable to contact the patient to reschedule for complex care management services.   Harlene Satterfield  [facility]  [facility], [facility] Guide  Direct Dial: [phone]  Fax [phone]
